# Patient Record
Sex: Female | Born: 2000 | Race: White | Hispanic: No | Marital: Single | Smoking: Never smoker
Health system: Southern US, Community
[De-identification: ages and names within clinical notes are randomized; demographics above are authoritative.]

## PROBLEM LIST (undated history)

## (undated) HISTORY — PX: EXTERNAL EAR SURGERY: SHX627

---
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
## 2019-07-27 ENCOUNTER — Ambulatory Visit: Payer: Self-pay | Attending: Internal Medicine

## 2019-07-27 DIAGNOSIS — Z23 Encounter for immunization: Secondary | ICD-10-CM

## 2019-07-27 NOTE — Progress Notes (Signed)
   Covid-19 Vaccination Clinic  Name:  Emily Crane    MRN: [id number] DOB: June 30, 2000  07/27/2019  [name] was observed post Covid-19 immunization for 15 minutes without incident. She was provided with Vaccine Information Sheet and instruction to access the V-Safe system.   [name] was instructed to call 911 with any severe reactions post vaccine: Marland Kitchen Difficulty breathing  . Swelling of face and throat  . A fast heartbeat  . A bad rash all over body  . Dizziness and weakness   Immunizations Administered    Name Date Dose VIS Date Route   Pfizer COVID-19 Vaccine 07/27/2019  4:31 PM 0.3 mL 04/29/2018 Intramuscular   Manufacturer: [employer], [employer]   Lot: [id number]   NDC: [address]-4

## 2019-08-24 ENCOUNTER — Ambulatory Visit: Payer: Self-pay | Attending: Internal Medicine

## 2019-08-24 DIAGNOSIS — Z23 Encounter for immunization: Secondary | ICD-10-CM

## 2019-08-24 NOTE — Progress Notes (Signed)
   Covid-19 Vaccination Clinic  Name:  Alexsys Eskin    MRN: [id number] DOB: 11-Apr-2000  08/24/2019  Ms. Tri was observed post Covid-19 immunization for 15 minutes without incident. She was provided with Vaccine Information Sheet and instruction to access the V-Safe system.   Ms. Metzinger was instructed to call 911 with any severe reactions post vaccine: Marland Kitchen Difficulty breathing  . Swelling of face and throat  . A fast heartbeat  . A bad rash all over body  . Dizziness and weakness   Immunizations Administered    Name Date Dose VIS Date Route   Pfizer COVID-19 Vaccine 08/24/2019  4:30 PM 0.3 mL 04/29/2018 Intramuscular   Manufacturer: [employer], [employer]   Lot: [id number]   NDC: [address]-8

## 2021-05-22 ENCOUNTER — Other Ambulatory Visit: Payer: Self-pay

## 2021-05-22 ENCOUNTER — Emergency Department (INDEPENDENT_AMBULATORY_CARE_PROVIDER_SITE_OTHER)
Admission: RE | Admit: 2021-05-22 | Discharge: 2021-05-22 | Disposition: A | Discharge status: Home / Self Care | Payer: BLUE CROSS/BLUE SHIELD | Source: Ambulatory Visit

## 2021-05-22 VITALS — BP 119/85 | HR 124 | Temp 99.0°F | Resp 20 | Ht 63.0 in | Wt 215.0 lb

## 2021-05-22 DIAGNOSIS — H109 Unspecified conjunctivitis: Secondary | ICD-10-CM

## 2021-05-22 DIAGNOSIS — J309 Allergic rhinitis, unspecified: Secondary | ICD-10-CM

## 2021-05-22 DIAGNOSIS — J3489 Other specified disorders of nose and nasal sinuses: Secondary | ICD-10-CM

## 2021-05-22 DIAGNOSIS — J0100 Acute maxillary sinusitis, unspecified: Secondary | ICD-10-CM

## 2021-05-22 MED ORDER — AMOXICILLIN-POT CLAVULANATE 875-125 MG PO TABS: 1.0000 | ORAL_TABLET | Freq: Two times a day (BID) | ORAL | 0 refills | Status: AC

## 2021-05-22 MED ORDER — FEXOFENADINE HCL 180 MG PO TABS: 180.0000 mg | ORAL_TABLET | Freq: Every day | ORAL | 0 refills | Status: AC

## 2021-05-22 MED ORDER — MOXIFLOXACIN HCL 0.5 % OP SOLN: 1.0000 [drp] | Freq: Three times a day (TID) | OPHTHALMIC | 0 refills | Status: AC

## 2021-05-22 MED ORDER — PREDNISONE 20 MG PO TABS: ORAL_TABLET | ORAL | 0 refills | Status: DC

## 2021-05-22 NOTE — Discharge Instructions (Addendum)
Advised patient to instill eyedrops as directed.  Advised if right eye becomes symptomatic you may treat right eye with Vigamox as well.  Instructed patient to take medication as directed with food to completion.  Advised patient to take prednisone and Allegra with first dose of Augmentin for the next 5 of 10 days.  Advised may use Allegra as needed afterwards for concurrent postnasal drainage/drip.  Encouraged patient to increase daily water intake while taking these medications.  Advised patient if symptoms worsen and/or unresolved please follow-up with PCP or here for further evaluation. ?

## 2021-05-22 NOTE — ED Provider Notes (Signed)
?[facility] ? ? ? ?CSN: [id number] ?Arrival date & time: 05/22/21  1145 ? ? ?  ? ?History   ?Chief Complaint ?Chief Complaint  ?Patient presents with  ? Eye Problem  ?  Left eye is red, swollen and has some drainage. X2 days  ? Nasal Congestion  ?  Nasal congestion. X2 weeks  ? ? ?HPI ?Emily Crane is a 21 y.o. female.  ? ?HPI-year-old female presents with left eye redness, swelling and drainage for 2 days.  Additionally patient reports nasal congestion for 2 weeks.  Reports she was negative for COVID-19 on home test on Sunday, 05/21/2021. ? ?History reviewed. No pertinent past medical history. ? ?Patient Active Problem List  ? Diagnosis Date Noted  ? Fracture, finger, distal phalanx 07/25/2015  ? FOOT PAIN, RIGHT 04/28/2010  ? CLOSED FRACTURE OF METATARSAL BONE 04/28/2010  ? ? ?Past Surgical History:  ?Procedure Laterality Date  ? EXTERNAL EAR SURGERY    ? ? ?OB History   ?No obstetric history on file. ?  ? ? ? ?Home Medications   ? ?Prior to Admission medications   ?Medication Sig Start Date End Date Taking? Authorizing Provider  ?amoxicillin-clavulanate (AUGMENTIN) 875-125 MG tablet Take 1 tablet by mouth every 12 (twelve) hours for 10 days. 05/22/21 06/01/21 Yes Trevor Ihaagan, Blayn Whetsell, FNP  ?fexofenadine (ALLEGRA ALLERGY) 180 MG tablet Take 1 tablet (180 mg total) by mouth daily for 15 days. 05/22/21 06/06/21 Yes Trevor Ihaagan, Nena Hampe, FNP  ?moxifloxacin (VIGAMOX) 0.5 % ophthalmic solution Place 1 drop into the left eye 3 (three) times daily for 5 days. 05/22/21 05/27/21 Yes Trevor Ihaagan, Elowyn Raupp, FNP  ?predniSONE (DELTASONE) 20 MG tablet Take 3 tabs PO daily x 5 days. 05/22/21  Yes Trevor Ihaagan, Tomislav Micale, FNP  ? ? ?Family History ?Family History  ?Problem Relation Age of Onset  ? Cancer Mother   ?     breast  ? Hyperlipidemia Maternal Grandfather   ? ? ?Social History ?Social History  ? ?Tobacco Use  ? Smoking status: Never  ?Vaping Use  ? Vaping Use: Never used  ?Substance Use Topics  ? Alcohol use: Yes  ? Drug use: No   ? ? ? ?Allergies   ?Patient has no known allergies. ? ? ?Review of Systems ?Review of Systems  ?HENT:  Positive for congestion.   ?Eyes:  Positive for discharge and redness.  ?All other systems reviewed and are negative. ? ? ?Physical Exam ?Triage Vital Signs ?ED Triage Vitals  ?Enc Vitals Group  ?   BP 05/22/21 1212 119/85  ?   Pulse Rate 05/22/21 1212 (!) 124  ?   Resp 05/22/21 1212 20  ?   Temp 05/22/21 1212 99 ?F (37.2 ?C)  ?   Temp Source 05/22/21 1212 Oral  ?   SpO2 05/22/21 1212 96 %  ?   Weight 05/22/21 1210 215 lb (97.5 kg)  ?   Height 05/22/21 1210 5\' 3"  (1.6 m)  ?   Head Circumference --   ?   Peak Flow --   ?   Pain Score 05/22/21 1210 0  ?   Pain Loc --   ?   Pain Edu? --   ?   Excl. in GC? --   ? ?No data found. ? ?Updated Vital Signs ?BP 119/85 (BP Location: Left Arm)   Pulse (!) 124   Temp 99 ?F (37.2 ?C) (Oral)   Resp 20   Ht 5\' 3"  (1.6 m)   Wt 215 lb (97.5 kg)  SpO2 96%   BMI 38.09 kg/m?  ? ?Physical Exam ?Vitals and nursing note reviewed.  ?Constitutional:   ?   General: She is not in acute distress. ?   Appearance: Normal appearance. She is obese. She is not ill-appearing.  ?HENT:  ?   Head: Normocephalic and atraumatic.  ?   Right Ear: Tympanic membrane and external ear normal.  ?   Left Ear: Tympanic membrane and external ear normal.  ?   Ears:  ?   Comments: Moderate eustachian tube dysfunction noted bilaterally ?   Nose:  ?   Right Sinus: Maxillary sinus tenderness present.  ?   Left Sinus: Maxillary sinus tenderness present.  ?   Comments: Turbinates are erythematous/edematous ?   Mouth/Throat:  ?   Mouth: Mucous membranes are moist.  ?   Pharynx: Oropharynx is clear.  ?   Comments: Moderate to significant amount of clear drainage of posterior oropharynx noted ?Eyes:  ?   Extraocular Movements: Extraocular movements intact.  ?   Conjunctiva/sclera: Conjunctivae normal.  ?   Pupils: Pupils are equal, round, and reactive to light.  ?   Comments: Left eye: Sclera with +2 injection,  dried mucopurulent discharge noted at inner canthus  ?Cardiovascular:  ?   Rate and Rhythm: Normal rate and regular rhythm.  ?   Pulses: Normal pulses.  ?   Heart sounds: Normal heart sounds. No murmur heard. ?Pulmonary:  ?   Effort: Pulmonary effort is normal.  ?   Breath sounds: Normal breath sounds. No wheezing, rhonchi or rales.  ?Musculoskeletal:  ?   Cervical back: Normal range of motion and neck supple.  ?Skin: ?   General: Skin is warm and dry.  ?Neurological:  ?   General: No focal deficit present.  ?   Mental Status: She is alert and oriented to person, place, and time.  ? ? ? ?UC Treatments / Results  ?Labs ?(all labs ordered are listed, but only abnormal results are displayed) ?Labs Reviewed - No data to display ? ?EKG ? ? ?Radiology ?No results found. ? ?Procedures ?Procedures (including critical care time) ? ?Medications Ordered in UC ?Medications - No data to display ? ?Initial Impression / Assessment and Plan / UC Course  ?I have reviewed the triage vital signs and the nursing notes. ? ?Pertinent labs & imaging results that were available during my care of the patient were reviewed by me and considered in my medical decision making (see chart for details). ? ?  ? ?MDM: 1.  Bacterial conjunctivitis of left eye-Vigamox; 2.  Acute maxillary sinusitis, recurrence not specified-Rx'd Augmentin; 3.  Sinus pressure-next prednisone; 4.  Allergic rhinitis-Rx'd Allegra. Advised patient to instill eyedrops as directed.  Advised if right eye becomes symptomatic you may treat right eye with Vigamox as well.  Instructed patient to take medication as directed with food to completion.  Advised patient to take prednisone and Allegra with first dose of Augmentin for the next 5 of 10 days.  Advised may use Allegra as needed afterwards for concurrent postnasal drainage/drip.  Encouraged patient to increase daily water intake while taking these medications.  School excuse note provided per patient request prior to  discharge today.  Advised patient if symptoms worsen and/or unresolved please follow-up with PCP or here for further evaluation.  Patient discharged home, hemodynamically stable ?Final Clinical Impressions(s) / UC Diagnoses  ? ?Final diagnoses:  ?Bacterial conjunctivitis of left eye  ?Acute maxillary sinusitis, recurrence not specified  ?Allergic rhinitis, unspecified  seasonality, unspecified trigger  ?Sinus pressure  ? ? ? ?Discharge Instructions   ? ?  ?Advised patient to instill eyedrops as directed.  Advised if right eye becomes symptomatic you may treat right eye with Vigamox as well.  Instructed patient to take medication as directed with food to completion.  Advised patient to take prednisone and Allegra with first dose of Augmentin for the next 5 of 10 days.  Advised may use Allegra as needed afterwards for concurrent postnasal drainage/drip.  Encouraged patient to increase daily water intake while taking these medications.  Advised patient if symptoms worsen and/or unresolved please follow-up with PCP or here for further evaluation. ? ? ? ? ?ED Prescriptions   ? ? Medication Sig Dispense Auth. Provider  ? moxifloxacin (VIGAMOX) 0.5 % ophthalmic solution Place 1 drop into the left eye 3 (three) times daily for 5 days. 3 mL Trevor Iha, FNP  ? amoxicillin-clavulanate (AUGMENTIN) 875-125 MG tablet Take 1 tablet by mouth every 12 (twelve) hours for 10 days. 20 tablet Trevor Iha, FNP  ? predniSONE (DELTASONE) 20 MG tablet Take 3 tabs PO daily x 5 days. 15 tablet Trevor Iha, FNP  ? fexofenadine (ALLEGRA ALLERGY) 180 MG tablet Take 1 tablet (180 mg total) by mouth daily for 15 days. 15 tablet Trevor Iha, FNP  ? ?  ? ?PDMP not reviewed this encounter. ?  ?Trevor Iha, FNP ?05/22/21 1401 ? ?

## 2021-05-22 NOTE — ED Triage Notes (Signed)
Pt states that she has some drainage, swelling and redness of left eye. X2 days ? ?Pt states that she also has some nasal congestion. X2 weeks.  ?Pt states that she is vaccinated for covid.  ?Pt states that she hasn't had flu vaccine.  ? ?Pt states that she did have a negative covid test on 3/19. ?

## 2021-06-06 ENCOUNTER — Emergency Department
Admission: RE | Admit: 2021-06-06 | Discharge: 2021-06-06 | Disposition: A | Discharge status: Home / Self Care | Payer: BLUE CROSS/BLUE SHIELD | Source: Ambulatory Visit

## 2021-06-06 VITALS — BP 121/82 | HR 102 | Temp 98.5°F | Resp 16 | Ht 63.0 in | Wt 215.0 lb

## 2021-06-06 DIAGNOSIS — J029 Acute pharyngitis, unspecified: Secondary | ICD-10-CM | POA: Diagnosis not present

## 2021-06-06 LAB — POCT RAPID STREP A (OFFICE): Rapid Strep A Screen: NEGATIVE

## 2021-06-06 NOTE — ED Triage Notes (Signed)
Sore throat since last night  ?Her boyfriend also has a sore throat  ( rapid test today for strep was negative) ?Denies fever  ?No OTC meds  ?Pain with swallowing  ?

## 2021-06-06 NOTE — ED Provider Notes (Signed)
?[facility] ? ? ? ?CSN: [id number] ?Arrival date & time: 06/06/21  1451 ? ? ?  ? ?History   ?Chief Complaint ?Chief Complaint  ?Patient presents with  ? Sore Throat  ?  Entered by patient  ? ? ?HPI ?Arora Coakley is a 21 y.o. female.  ? ?HPI 21 year old female presents with sore throat since last night.  Reports her boyfriend has sore throat as well with negative strep test earlier today.  Denies fever, reports pain with swallowing.  Patient was evaluated and treated by me on 05/22/2021 for acute maxillary sinusitis, sinus pressure, allergic rhinitis, and bacterial conjunctivitis. ? ?History reviewed. No pertinent past medical history. ? ?Patient Active Problem List  ? Diagnosis Date Noted  ? Fracture, finger, distal phalanx 07/25/2015  ? FOOT PAIN, RIGHT 04/28/2010  ? CLOSED FRACTURE OF METATARSAL BONE 04/28/2010  ? ? ?Past Surgical History:  ?Procedure Laterality Date  ? EXTERNAL EAR SURGERY    ? ? ?OB History   ?No obstetric history on file. ?  ? ? ? ?Home Medications   ? ?Prior to Admission medications   ?Medication Sig Start Date End Date Taking? Authorizing Provider  ?fexofenadine (ALLEGRA ALLERGY) 180 MG tablet Take 1 tablet (180 mg total) by mouth daily for 15 days. 05/22/21 06/06/21  Trevor Iha, FNP  ? ? ?Family History ?Family History  ?Problem Relation Age of Onset  ? Thyroid disease Mother   ? Cancer Mother   ?     breast  ? Hyperlipidemia Maternal Grandfather   ? ? ?Social History ?Social History  ? ?Tobacco Use  ? Smoking status: Never  ?Vaping Use  ? Vaping Use: Never used  ?Substance Use Topics  ? Alcohol use: Yes  ? Drug use: No  ? ? ? ?Allergies   ?Patient has no known allergies. ? ? ?Review of Systems ?Review of Systems  ?HENT:  Positive for sore throat.   ?All other systems reviewed and are negative. ? ? ?Physical Exam ?Triage Vital Signs ?ED Triage Vitals  ?Enc Vitals Group  ?   BP 06/06/21 1522 121/82  ?   Pulse Rate 06/06/21 1522 (!) 102  ?   Resp 06/06/21 1522 16  ?   Temp  06/06/21 1522 98.5 ?F (36.9 ?C)  ?   Temp Source 06/06/21 1522 Oral  ?   SpO2 06/06/21 1522 98 %  ?   Weight 06/06/21 1526 215 lb (97.5 kg)  ?   Height 06/06/21 1526 5\' 3"  (1.6 m)  ?   Head Circumference --   ?   Peak Flow --   ?   Pain Score 06/06/21 1526 4  ?   Pain Loc --   ?   Pain Edu? --   ?   Excl. in GC? --   ? ?No data found. ? ?Updated Vital Signs ?BP 121/82 (BP Location: Right Arm)   Pulse (!) 102   Temp 98.5 ?F (36.9 ?C) (Oral)   Resp 16   Ht 5\' 3"  (1.6 m)   Wt 215 lb (97.5 kg)   SpO2 98%   BMI 38.09 kg/m?  ? ?Physical Exam ?Vitals and nursing note reviewed.  ?Constitutional:   ?   General: She is not in acute distress. ?   Appearance: She is well-developed. She is obese. She is not ill-appearing.  ?HENT:  ?   Head: Normocephalic and atraumatic.  ?   Right Ear: Tympanic membrane and ear canal normal.  ?   Left Ear: Tympanic  membrane and ear canal normal.  ?   Mouth/Throat:  ?   Mouth: Mucous membranes are moist.  ?   Pharynx: Oropharynx is clear. Uvula midline.  ?Eyes:  ?   Conjunctiva/sclera: Conjunctivae normal.  ?   Pupils: Pupils are equal, round, and reactive to light.  ?Cardiovascular:  ?   Rate and Rhythm: Normal rate and regular rhythm.  ?   Heart sounds: Normal heart sounds.  ?Pulmonary:  ?   Effort: Pulmonary effort is normal.  ?   Breath sounds: Normal breath sounds. No wheezing, rhonchi or rales.  ?Musculoskeletal:  ?   Cervical back: Normal range of motion and neck supple.  ?Skin: ?   General: Skin is warm and dry.  ?Neurological:  ?   General: No focal deficit present.  ?   Mental Status: She is alert and oriented to person, place, and time.  ? ? ? ?UC Treatments / Results  ?Labs ?(all labs ordered are listed, but only abnormal results are displayed) ?Labs Reviewed  ?CULTURE, GROUP A STREP  ?POCT RAPID STREP A (OFFICE)  ? ? ?EKG ? ? ?Radiology ?No results found. ? ?Procedures ?Procedures (including critical care time) ? ?Medications Ordered in UC ?Medications - No data to  display ? ?Initial Impression / Assessment and Plan / UC Course  ?I have reviewed the triage vital signs and the nursing notes. ? ?Pertinent labs & imaging results that were available during my care of the patient were reviewed by me and considered in my medical decision making (see chart for details). ? ?  ? ?MDM: 1.  Acute pharyngitis, unspecified etiology-Advised patient may use OTC Tylenol 1000 mg 1-2 times daily alternating with OTC Ibuprofen 600 to 800 mg 1-2 times daily, as needed for sore throat pain.  Advised patient we will follow-up with throat culture results once received.  Patient discharged home, hemodynamically stable. ?Final Clinical Impressions(s) / UC Diagnoses  ? ?Final diagnoses:  ?Acute pharyngitis, unspecified etiology  ? ? ? ?Discharge Instructions   ? ?  ?Advised patient may use OTC Tylenol 1000 mg 1-2 times daily alternating with OTC Ibuprofen 600 to 800 mg 1-2 times daily, as needed for sore throat pain.  Advised patient we will follow-up with throat culture results once received. ? ? ? ? ?ED Prescriptions   ?None ?  ? ?PDMP not reviewed this encounter. ?  ?Trevor Iha, FNP ?06/06/21 1559 ? ?

## 2021-06-06 NOTE — Discharge Instructions (Addendum)
Advised patient may use OTC Tylenol 1000 mg 1-2 times daily alternating with OTC Ibuprofen 600 to 800 mg 1-2 times daily, as needed for sore throat pain.  Advised patient we will follow-up with throat culture results once received. ?

## 2021-06-09 LAB — CULTURE, GROUP A STREP: Strep A Culture: NEGATIVE
